# Patient Record
Sex: Female | Born: 2000 | Race: White | Hispanic: No | Marital: Single | Smoking: Never smoker
Health system: Southern US, Community
[De-identification: ages and names within clinical notes are randomized; demographics above are authoritative.]

## PROBLEM LIST (undated history)

## (undated) DIAGNOSIS — D649 Anemia, unspecified: Secondary | ICD-10-CM

## (undated) HISTORY — DX: Anemia, unspecified: D64.9

---
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
## 2022-03-16 NOTE — Progress Notes (Signed)
21 y.o. G0P0000 Single Caucasian female here for annual exam.   No concerns today.  Mother present for the discussion portion of the visit and was excused briefly.   Has less bleeding with birth control. No significant cramping.  No irregular bleeding.  Does well with chewable and wants to continue with this.   Last blood work in January, 2023.  Normal Hgb and iron.   Mom just finished treatment for lymphoma.  PCP: [facility]     Patient's last menstrual period was 03/12/2022 (exact date).     Period Cycle (Days): 30 Period Duration (Days): 7 Menstrual Flow:  (first few heavier then taper) Menstrual Control: Maxi pad Menstrual Control Change Freq (Hours): changes maxi pad 2-3 times/day on heaviest day Dysmenorrhea: None     Sexually active: No.  Not SA ever.  The current method of family planning is OCP (estrogen/progesterone).    Exercising: Yes.     Works at a summer camp Smoker:  no  Health Maintenance: Pap:  n/a History of abnormal Pap:  n/a MMG:  n/a Colonoscopy:  n/a BMD:   n/a  Result  n/a TDaP:  UTD  Gardasil:   no HIV:no Hep C: no Screening Labs:  no.    reports that she has never smoked. She has never used smokeless tobacco. She reports that she does not drink alcohol and does not use drugs.  Past Medical History:  Diagnosis Date   Anemia    COVID-19 10/27/2020    Past Surgical History:  Procedure Laterality Date   WISDOM TOOTH EXTRACTION  03/2020    Current Outpatient Medications  Medication Sig Dispense Refill   Norethin Ace-Eth Estrad-FE 1-20 MG-MCG(24) CHEW Chew 1 tablet by mouth daily. 84 tablet 1   No current facility-administered medications for this visit.    Family History  Problem Relation Age of Onset   Cancer Mother 43       lymphoma    Review of Systems  All other systems reviewed and are negative.   Exam:   BP 118/74   Pulse 100   Ht 5\' 3"  (1.6 m)   Wt 133 lb (60.3 kg)   LMP 03/12/2022 (Exact Date)    SpO2 99%   BMI 23.56 kg/m     General appearance: alert, cooperative and appears stated age Head: normocephalic, without obvious abnormality, atraumatic Neck: no adenopathy, supple, symmetrical, trachea midline and thyroid normal to inspection and palpation Lungs: clear to auscultation bilaterally Breasts: normal appearance, no masses or tenderness, No nipple retraction or dimpling, No nipple discharge or bleeding, No axillary adenopathy Heart: regular rate and rhythm Abdomen: soft, non-tender; no masses, no organomegaly Extremities: extremities normal, atraumatic, no cyanosis or edema Skin: skin color, texture, turgor normal. No rashes or lesions Lymph nodes: cervical, supraclavicular, and axillary nodes normal. Neurologic: grossly normal  Pelvic: not needed.  Chaperone was present for exam:  03/14/2022, CMA  Assessment:   Well woman visit without gynecologic exam. On COCs.  Hx anemia.   Plan:  Pap age 50 yo. Patient declines Gardasil vaccine.  She will check on her last TDap.  Guidelines for Calcium, Vitamin D, regular exercise program including cardiovascular and weight bearing exercise. Refill of COCs for one year.  Follow up annually and prn.   After visit summary provided.

## 2022-03-18 ENCOUNTER — Encounter: Payer: Self-pay | Admitting: Obstetrics and Gynecology

## 2022-03-18 ENCOUNTER — Ambulatory Visit (INDEPENDENT_AMBULATORY_CARE_PROVIDER_SITE_OTHER): Payer: 59 | Admitting: Obstetrics and Gynecology

## 2022-03-18 VITALS — BP 118/74 | HR 100 | Ht 63.0 in | Wt 133.0 lb

## 2022-03-18 DIAGNOSIS — Z3041 Encounter for surveillance of contraceptive pills: Secondary | ICD-10-CM | POA: Diagnosis not present

## 2022-03-18 DIAGNOSIS — Z0000 Encounter for general adult medical examination without abnormal findings: Secondary | ICD-10-CM

## 2022-03-18 DIAGNOSIS — Z01419 Encounter for gynecological examination (general) (routine) without abnormal findings: Secondary | ICD-10-CM

## 2022-03-18 MED ORDER — NORETHIN ACE-ETH ESTRAD-FE 1-20 MG-MCG(24) PO CHEW: 1.0000 | CHEWABLE_TABLET | Freq: Every day | ORAL | 3 refills | Status: AC

## 2023-02-01 IMAGING — CT CT ANGIO CHEST
2 of 7 series · 18 of 46 positions shown · IV contrast (APPLIED)
Comparison: None.

CLINICAL DATA: Hemoptysis.

EXAM:
CT ANGIOGRAPHY CHEST WITH CONTRAST
TECHNIQUE: Multidetector CT imaging of the chest was performed using the
standard protocol during bolus administration of intravenous
contrast. Multiplanar CT image reconstructions and MIPs were
obtained to evaluate the vascular anatomy.
CONTRAST:  50mL OMNIPAQUE IOHEXOL 350 MG/ML SOLN

[Series 11: thins · axial · 0.61mm/px · z∈[+874,+1100]mm · 15 of 364 slices shown]
[im 21/364  lung]
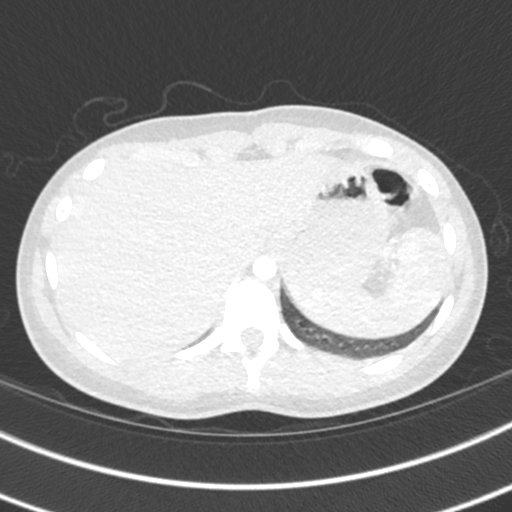
[im 41/364  soft-tissue]
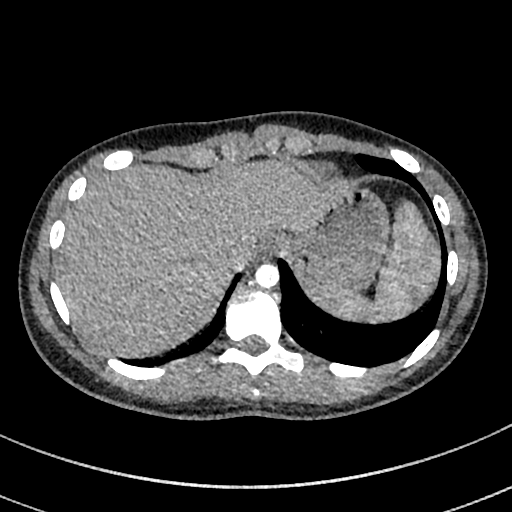
[im 61/364  lung]
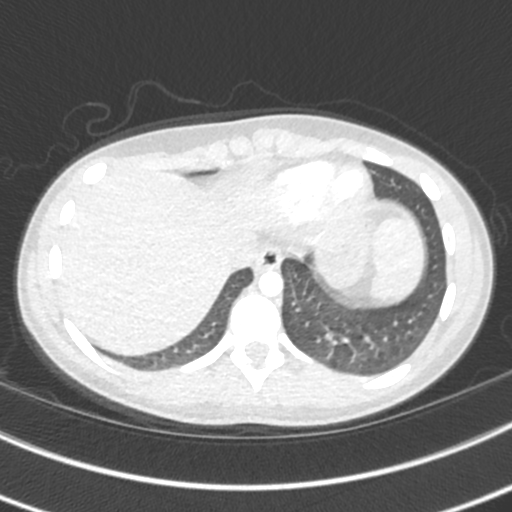
[im 81/364  soft-tissue]
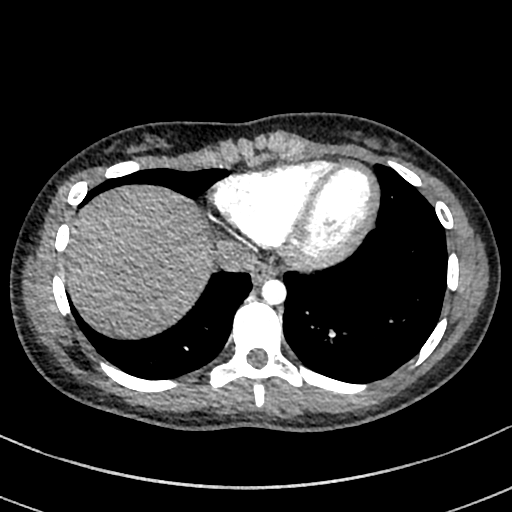
[im 122/364  lung]
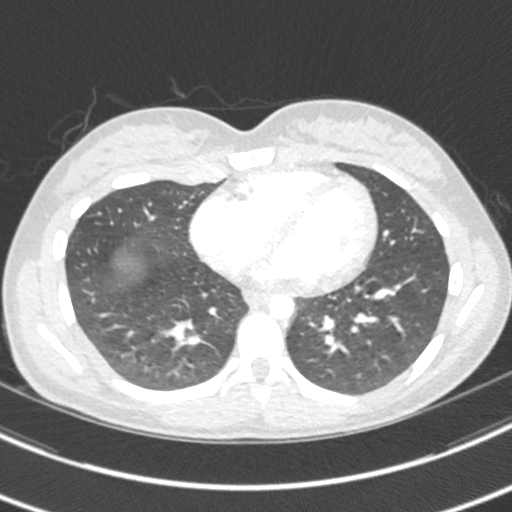
[im 142/364  soft-tissue]
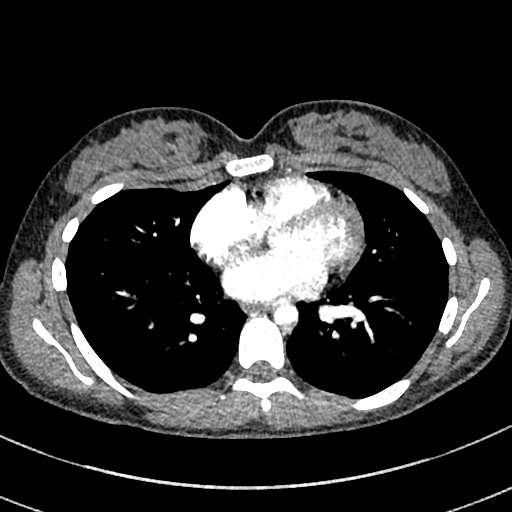
[im 162/364  lung]
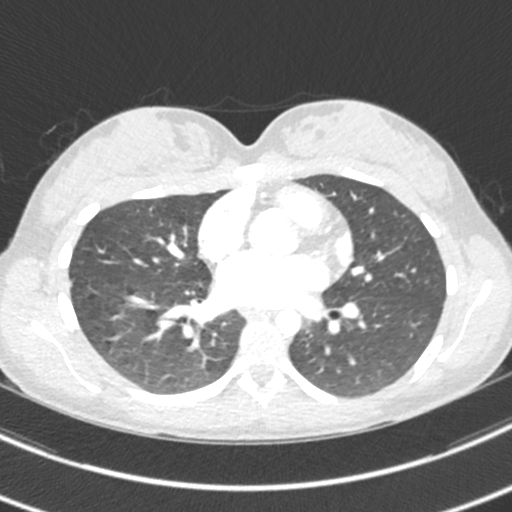
[im 182/364  soft-tissue]
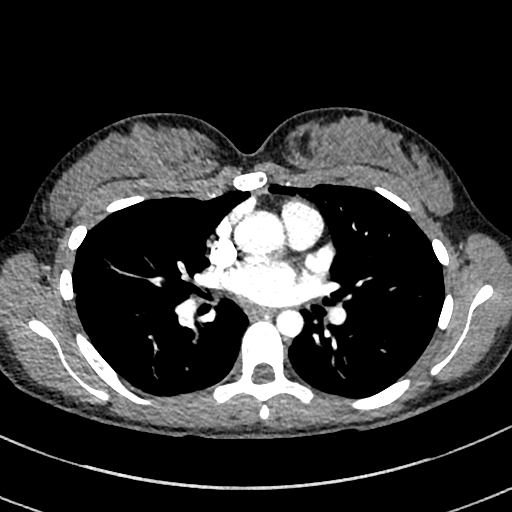
[im 202/364  lung]
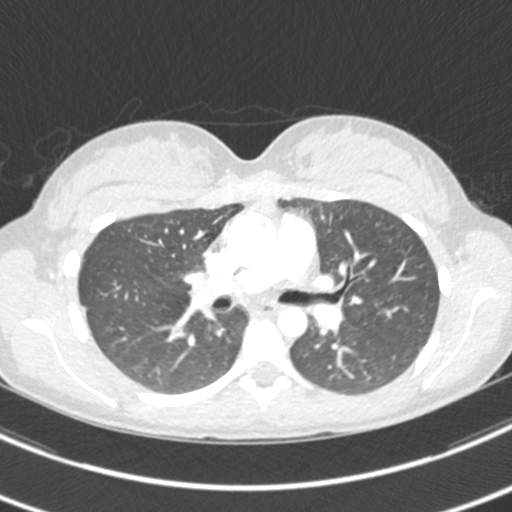
[im 222/364  soft-tissue]
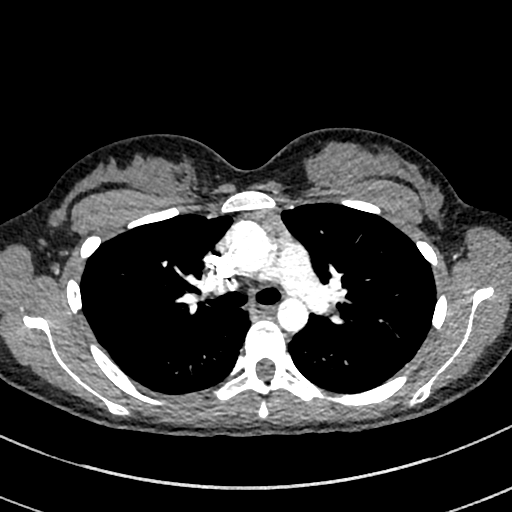
[im 243/364  lung]
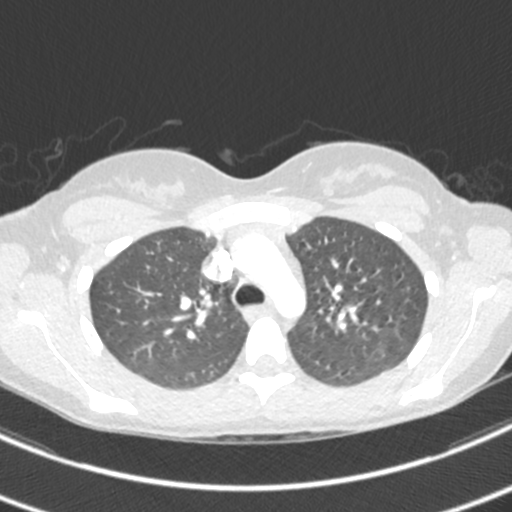
[im 283/364  soft-tissue]
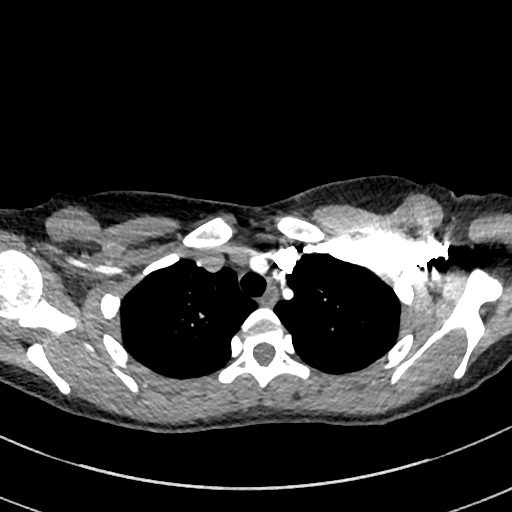
[im 303/364  lung]
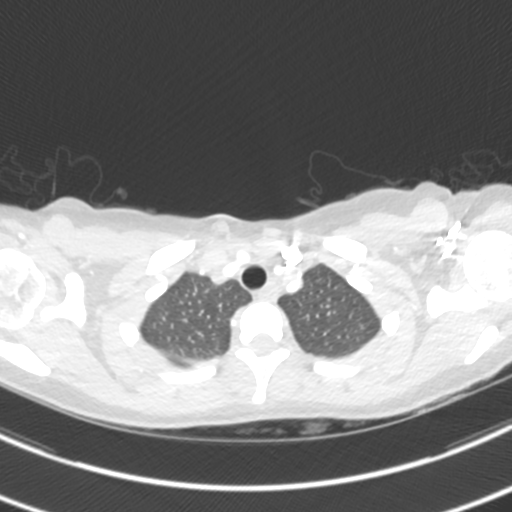
[im 323/364  soft-tissue]
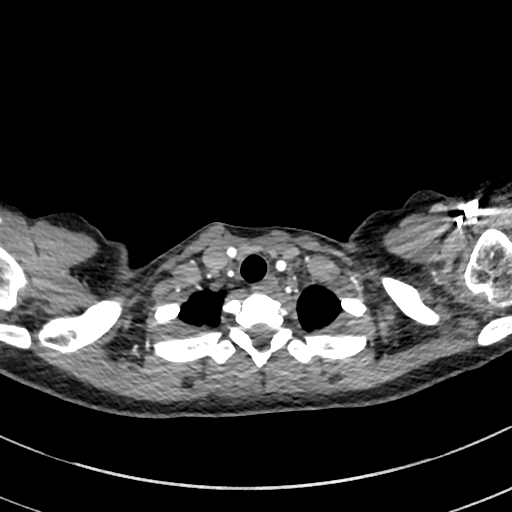
[im 343/364  lung]
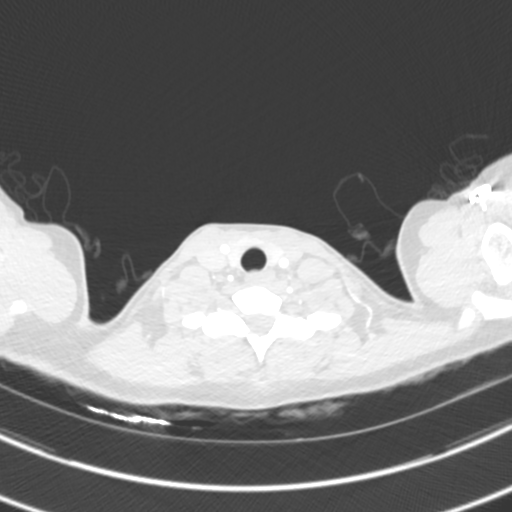

[Series 12: cor · coronal · 0.51mm/px · 3 of 106 slices shown]
[im 27/106  soft-tissue]
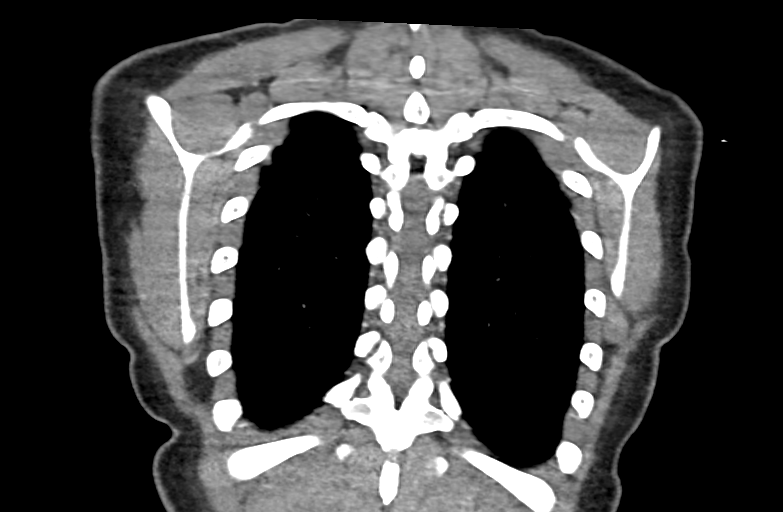
[im 53/106  soft-tissue]
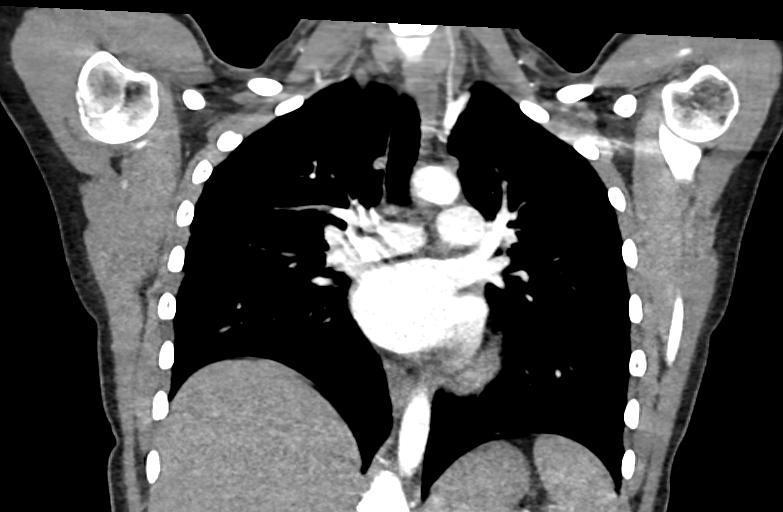
[im 79/106  soft-tissue]
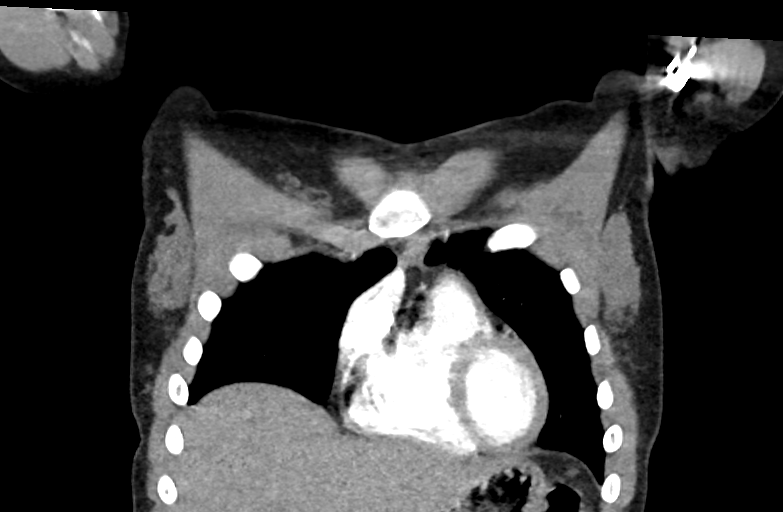

[18 of 46 positions shown; findings below may reference images not displayed]

FINDINGS: Cardiovascular: The heart is normal in size. No pericardial
effusion. The aorta is normal in caliber. No dissection. The branch
vessels are patent.

The pulmonary arterial tree is fairly well opacified. No filling
defects to suggest pulmonary embolism.

Mediastinum/Nodes: No mediastinal or hilar mass or adenopathy. The
esophagus is normal.

Lungs/Pleura: The lungs are clear. No infiltrates or effusions. No
pulmonary lesions.

Upper Abdomen: No significant upper abdominal findings.

Musculoskeletal: No breast masses, supraclavicular or axillary
adenopathy. The bony thorax is intact.

Review of the MIP images confirms the above findings.
IMPRESSION: 1. No CT findings for pulmonary embolism.
2. Normal thoracic aorta.
3. No acute pulmonary findings.

## 2023-02-01 IMAGING — DX DG CHEST 2V
2 series · 2 of 2 positions shown · non-contrast
Comparison: Chest radiograph dated 10/21/2018

CLINICAL DATA: 19-year-old female with hemoptysis.

EXAM:
CHEST - 2 VIEW

[chest pa]
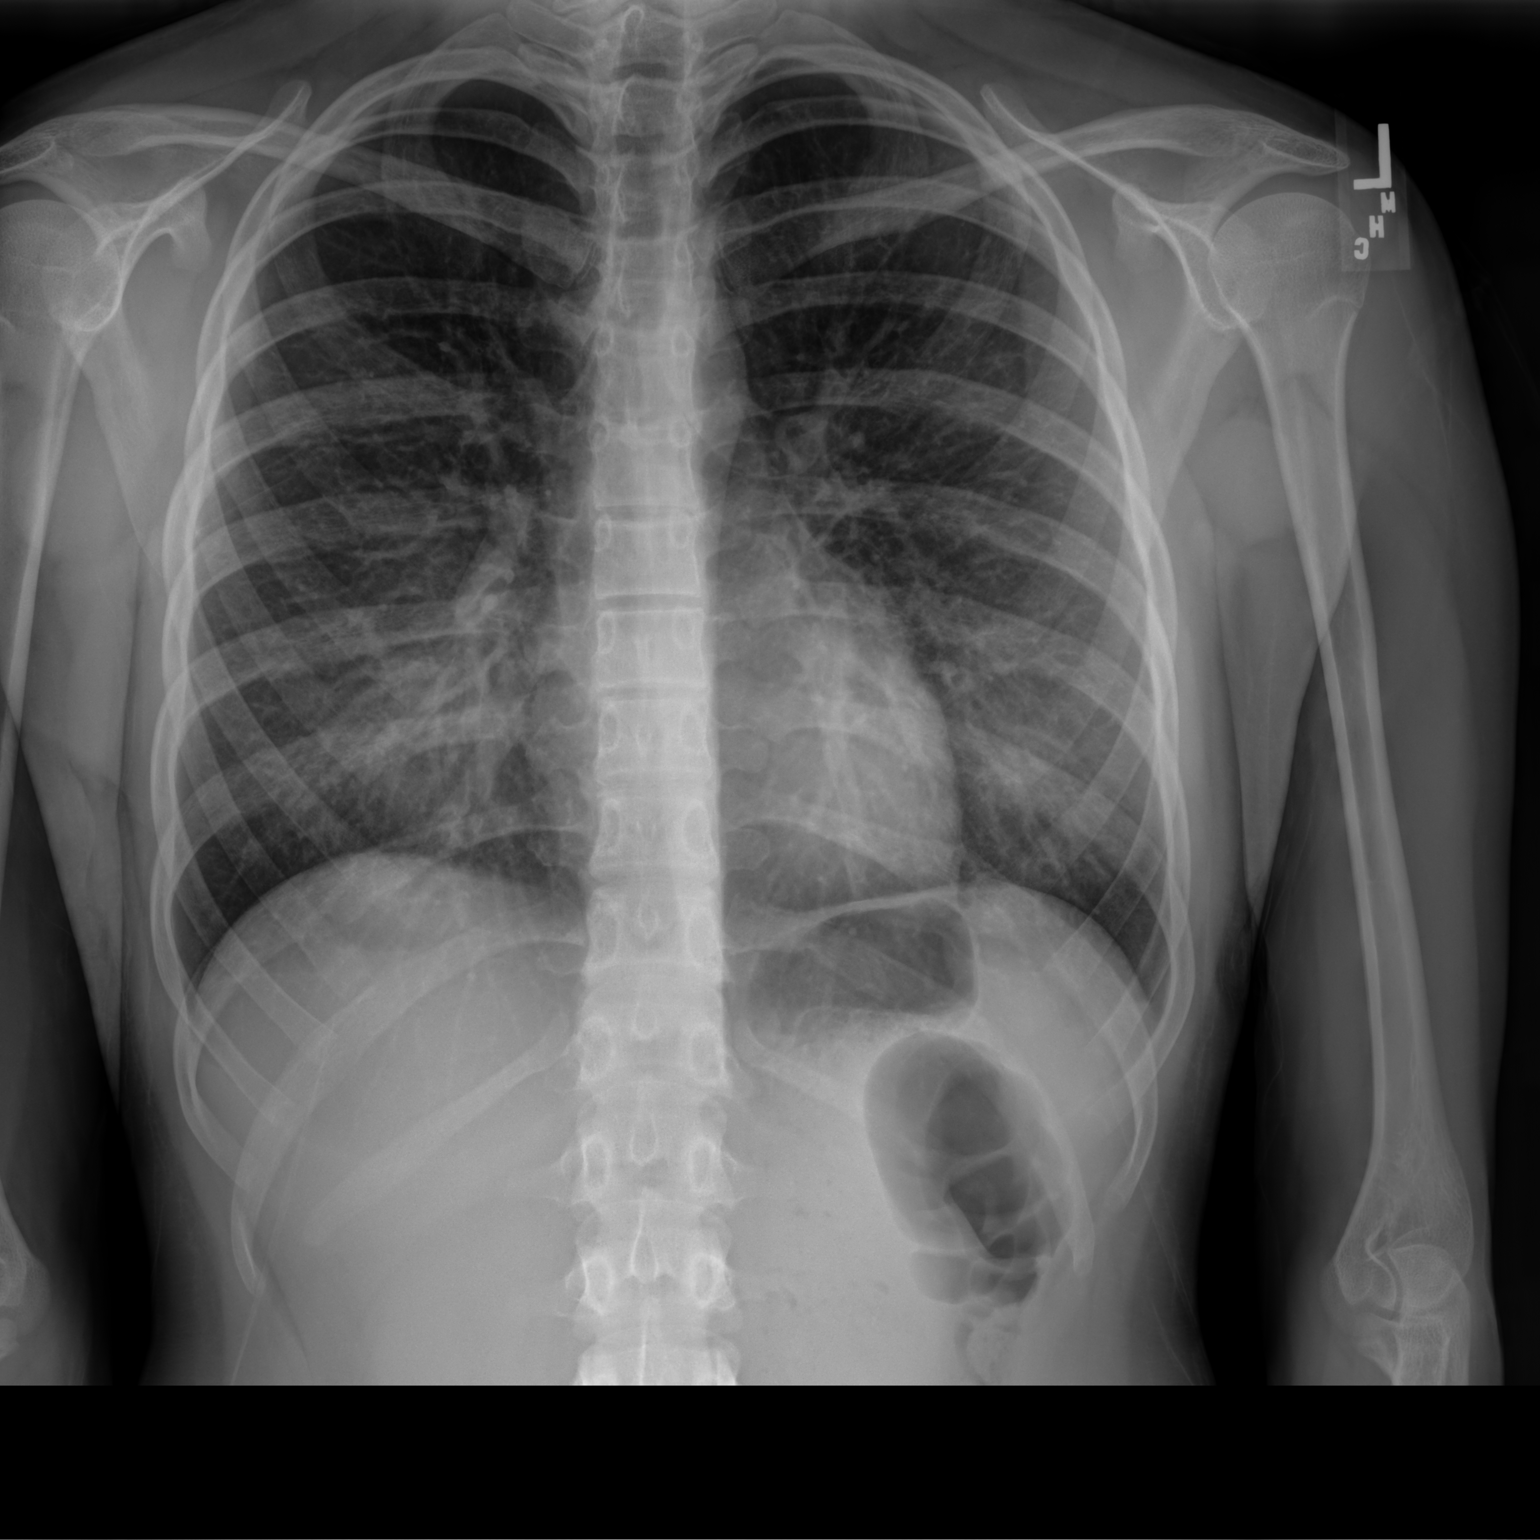

[chest lat]
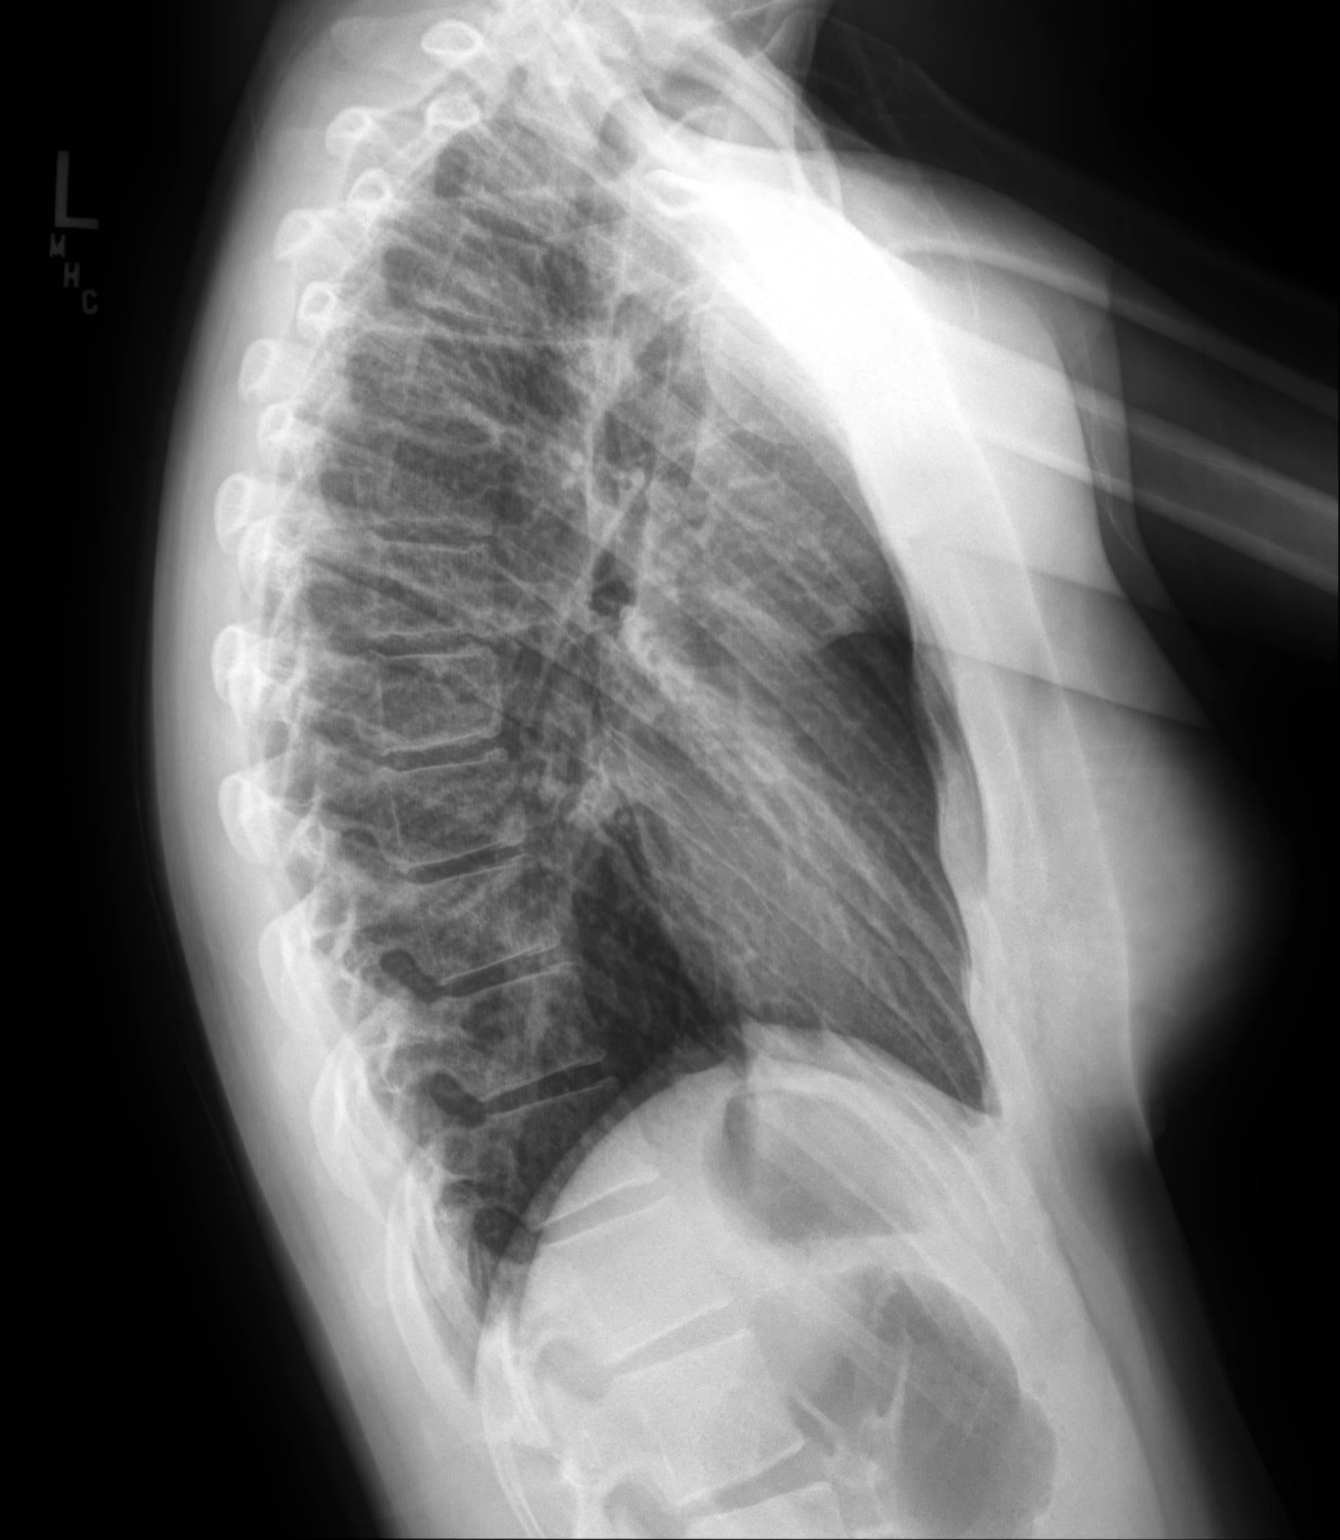

[2 of 2 positions shown; findings below may reference images not displayed]

FINDINGS: The heart size and mediastinal contours are within normal limits.
Both lungs are clear. The visualized skeletal structures are
unremarkable.
IMPRESSION: No active cardiopulmonary disease.

## 2023-03-09 NOTE — Progress Notes (Deleted)
22 y.o. G0P0000 Single Caucasian female here for annual exam.    PCP:     No LMP recorded.           Sexually active: No.  The current method of family planning is OCP (estrogen/progesterone).    Exercising: {yes no:314532}  {types:19826} Smoker:  no  Health Maintenance: Pap:  n/a History of abnormal Pap:  no MMG:  n/a Colonoscopy:  n/a BMD:   n/a  Result  n/a TDaP:  UTD Gardasil:   no HIV: n/a Hep C: n/a Screening Labs:  Hb today: ***, Urine today: ***   reports that she has never smoked. She has never used smokeless tobacco. She reports that she does not drink alcohol and does not use drugs.  Past Medical History:  Diagnosis Date   Anemia    COVID-19 10/27/2020    Past Surgical History:  Procedure Laterality Date   WISDOM TOOTH EXTRACTION  03/2020    Current Outpatient Medications  Medication Sig Dispense Refill   Norethin Ace-Eth Estrad-FE 1-20 MG-MCG(24) CHEW Chew 1 tablet by mouth daily. 84 tablet 3   No current facility-administered medications for this visit.    Family History  Problem Relation Age of Onset   Cancer Mother 53       lymphoma    Review of Systems  Exam:   There were no vitals taken for this visit.    General appearance: alert, cooperative and appears stated age Head: normocephalic, without obvious abnormality, atraumatic Neck: no adenopathy, supple, symmetrical, trachea midline and thyroid normal to inspection and palpation Lungs: clear to auscultation bilaterally Breasts: normal appearance, no masses or tenderness, No nipple retraction or dimpling, No nipple discharge or bleeding, No axillary adenopathy Heart: regular rate and rhythm Abdomen: soft, non-tender; no masses, no organomegaly Extremities: extremities normal, atraumatic, no cyanosis or edema Skin: skin color, texture, turgor normal. No rashes or lesions Lymph nodes: cervical, supraclavicular, and axillary nodes normal. Neurologic: grossly normal  Pelvic: External  genitalia:  no lesions              No abnormal inguinal nodes palpated.              Urethra:  normal appearing urethra with no masses, tenderness or lesions              Bartholins and Skenes: normal                 Vagina: normal appearing vagina with normal color and discharge, no lesions              Cervix: no lesions              Pap taken: {yes no:314532} Bimanual Exam:  Uterus:  normal size, contour, position, consistency, mobility, non-tender              Adnexa: no mass, fullness, tenderness              Rectal exam: {yes no:314532}.  Confirms.              Anus:  normal sphincter tone, no lesions  Chaperone was present for exam:  ***  Assessment:   Well woman visit with gynecologic exam.   Plan: Mammogram screening discussed. Self breast awareness reviewed. Pap and HR HPV as above. Guidelines for Calcium, Vitamin D, regular exercise program including cardiovascular and weight bearing exercise.   Follow up annually and prn.   Additional counseling given.  {yes [id number]. _______ minutes face to face  time of which over 50% was spent in counseling.    After visit summary provided.

## 2023-03-23 ENCOUNTER — Ambulatory Visit: Payer: 59 | Admitting: Obstetrics and Gynecology
